# Patient Record
Sex: Male | Born: 2000 | Race: White | Hispanic: No | Marital: Single | Smoking: Never smoker
Health system: Southern US, Community
[De-identification: ages and names within clinical notes are randomized; demographics above are authoritative.]

## PROBLEM LIST (undated history)

## (undated) HISTORY — PX: WISDOM TOOTH EXTRACTION: SHX21

---
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
## 2023-04-29 ENCOUNTER — Ambulatory Visit (INDEPENDENT_AMBULATORY_CARE_PROVIDER_SITE_OTHER): Payer: Medicaid Other | Admitting: Podiatry

## 2023-04-29 DIAGNOSIS — L600 Ingrowing nail: Secondary | ICD-10-CM

## 2023-04-29 NOTE — Progress Notes (Signed)
  Subjective:  Patient ID: Drew Goodwin, male    DOB: 31-Oct-2000,  MRN: [id number]  Chief Complaint  Patient presents with   Ingrown Toenail    ingrown on seond toe on rt foot,swollen, pink,tender to touch    22 y.o. male presents with the above complaint.  Patient presents with right second medial border ingrown painful to touch is progressive and worsens with ambulation or shoe pressure he would like to have removed he has not seen MRIs prior to seeing me pain scale 7 out of 10 dull achy in nature.   Review of Systems: Negative except as noted in the HPI. Denies N/V/F/Ch.  No past medical history on file.  Current Outpatient Medications:    neomycin-polymyxin-hydrocortisone (CORTISPORIN) OTIC solution, Apply 1-2 drops to toe after soaking twice a day (Patient not taking: Reported on 04/29/2023), Disp: 10 mL, Rfl: 0  Social History   Tobacco Use  Smoking Status Never  Smokeless Tobacco Never    No Known Allergies Objective:  There were no vitals filed for this visit. There is no height or weight on file to calculate BMI. Constitutional Well developed. Well nourished.  Vascular Dorsalis pedis pulses palpable bilaterally. Posterior tibial pulses palpable bilaterally. Capillary refill normal to all digits.  No cyanosis or clubbing noted. Pedal hair growth normal.  Neurologic Normal speech. Oriented to person, place, and time. Epicritic sensation to light touch grossly present bilaterally.  Dermatologic Painful ingrowing nail at medial nail borders of the second toe nail right. No other open wounds. No skin lesions.  Orthopedic: Normal joint ROM without pain or crepitus bilaterally. No visible deformities. No bony tenderness.   Radiographs: None Assessment:   1. Ingrown nail of second toe of right foot    Plan:  Patient was evaluated and treated and all questions answered.  Ingrown Nail, right -Patient elects to proceed with minor surgery to remove ingrown toenail  removal today. Consent reviewed and signed by patient. -Ingrown nail excised. See procedure note. -Educated on post-procedure care including soaking. Written instructions provided and reviewed. -Patient to follow up in 2 weeks for nail check.  Procedure: Excision of Ingrown Toenail Location: Right 2nd toe medial nail borders. Anesthesia: Lidocaine 1% plain; 1.5 mL and Marcaine 0.5% plain; 1.5 mL, digital block. Skin Prep: Betadine. Dressing: Silvadene; telfa; dry, sterile, compression dressing. Technique: Following skin prep, the toe was exsanguinated and a tourniquet was secured at the base of the toe. The affected nail border was freed, split with a nail splitter, and excised. Chemical matrixectomy was then performed with phenol and irrigated out with alcohol. The tourniquet was then removed and sterile dressing applied. Disposition: Patient tolerated procedure well. Patient to return in 2 weeks for follow-up.   No follow-ups on file.

## 2023-10-08 ENCOUNTER — Ambulatory Visit: Payer: Medicaid Other | Admitting: Podiatry

## 2023-10-13 ENCOUNTER — Encounter: Payer: Self-pay | Admitting: Podiatry

## 2023-10-13 ENCOUNTER — Ambulatory Visit: Payer: Medicaid Other | Admitting: Podiatry

## 2023-10-13 ENCOUNTER — Ambulatory Visit (INDEPENDENT_AMBULATORY_CARE_PROVIDER_SITE_OTHER): Payer: Medicaid Other

## 2023-10-13 DIAGNOSIS — D492 Neoplasm of unspecified behavior of bone, soft tissue, and skin: Secondary | ICD-10-CM | POA: Diagnosis not present

## 2023-10-13 DIAGNOSIS — L600 Ingrowing nail: Secondary | ICD-10-CM | POA: Diagnosis not present

## 2023-10-13 DIAGNOSIS — M79672 Pain in left foot: Secondary | ICD-10-CM

## 2023-10-13 MED ORDER — FLUOROURACIL 5 % EX CREA: TOPICAL_CREAM | Freq: Two times a day (BID) | CUTANEOUS | 2 refills | Status: AC

## 2023-10-13 NOTE — Progress Notes (Signed)
 Subjective:   Patient ID: Drew Goodwin, male   DOB: 23 y.o.   MRN: [id number]   HPI Patient presents with mom with lesions on the bottom of the left foot which have occurred recently and also discomfort in general in the left foot with patient in college   ROS      Objective:  Physical Exam  Neuro vascular status found to be intact with 2 keratotic lesions plantar left foot upon debridement show pinpoint bleeding pain to pressure with moderate discomfort extending into the arch     Assessment:  Probable for fascial inflammation may be compensatory with the pain around the lesions consistent with benign neoplasm formation     Plan:  H&P discussed both conditions with x-ray.  At this point support shoes will be utilized and I went ahead to deep debridement of the lesions exposed the verruca's material and apply chemical agent to create immune response along with sterile dressings and begin home Efudex  usage.  Will be seen back depending on response to conservative care  X-rays indicate moderate depression of the arch did not indicate spur formation or indications of arthritis

## 2023-12-13 ENCOUNTER — Ambulatory Visit (INDEPENDENT_AMBULATORY_CARE_PROVIDER_SITE_OTHER): Payer: Medicaid Other | Admitting: Podiatry

## 2023-12-13 ENCOUNTER — Encounter: Payer: Self-pay | Admitting: Podiatry

## 2023-12-13 DIAGNOSIS — D492 Neoplasm of unspecified behavior of bone, soft tissue, and skin: Secondary | ICD-10-CM | POA: Diagnosis not present

## 2023-12-13 NOTE — Progress Notes (Signed)
 Subjective:   Patient ID: Drew Goodwin, male   DOB: 23 y.o.   MRN: [id number]   HPI Patient presents with mother with multiple lesions plantar aspect left that were doing well started to get symptomatic still using home medicine   ROS      Objective:  Physical Exam  Neuro vascular status intact keratotic lesion times approximate 5 plantar aspect left foot slight pinpoint bleeding upon sharp debridement     Assessment:  Verruca plantaris plantar aspect left     Plan:  Debridement lesions applied chemical agent to create immune response and applied sterile dressings and explained what to do if blistering were to occur.  Reappoint as needed

## 2024-03-16 ENCOUNTER — Ambulatory Visit: Admitting: Podiatry

## 2024-03-16 DIAGNOSIS — M2011 Hallux valgus (acquired), right foot: Secondary | ICD-10-CM

## 2024-03-16 NOTE — Progress Notes (Signed)
  Subjective:  Patient ID: Drew Goodwin, male    DOB: 2000-10-20,  MRN: [id number]  Chief Complaint  Patient presents with   Toe Pain    Right hallux pain in the joint     23 y.o. male presents with the above complaint.  Patient presents with right moderate bunion deformity.  Patient states that he is getting big toe pain.  He states he notices occasional shooting tingling against the bunion.  He has not seen MRIs prior to seeing me denies any other acute complaints would like to discuss treatment options for this pain scale is 5 out of 10 dull aching nature  Review of Systems: Negative except as noted in the HPI. Denies N/V/F/Ch.  No past medical history on file.  Current Outpatient Medications:    fluorouracil  (EFUDEX ) 5 % cream, Apply topically 2 (two) times daily., Disp: 40 g, Rfl: 2   neomycin -polymyxin-hydrocortisone (CORTISPORIN) OTIC solution, Apply 1-2 drops to toe after soaking twice a day, Disp: 10 mL, Rfl: 0  Social History   Tobacco Use  Smoking Status Never  Smokeless Tobacco Never    No Known Allergies Objective:  There were no vitals filed for this visit. There is no height or weight on file to calculate BMI. Constitutional Well developed. Well nourished.  Vascular Dorsalis pedis pulses palpable bilaterally. Posterior tibial pulses palpable bilaterally. Capillary refill normal to all digits.  No cyanosis or clubbing noted. Pedal hair growth normal.  Neurologic Normal speech. Oriented to person, place, and time. Epicritic sensation to light touch grossly present bilaterally.  Dermatologic Nails well groomed and normal in appearance. No open wounds. No skin lesions.  Orthopedic: Normal joint ROM without pain or crepitus bilaterally. Hallux abductovalgus deformity present moderate.  This is track bound not a tracking deformity. Left 1st MPJ diminished range of motion. Left 1st TMT without gross hypermobility. Right 1st MPJ diminished range of motion   Right 1st TMT without gross hypermobility. Lesser digital contractures absent bilaterally.   Radiographs: None  Assessment:   1. Hav (hallux abducto valgus), right    Plan:  Patient was evaluated and treated and all questions answered.  Hallux abductovalgus deformity, right - All questions and concerns were discussed with the patient in extensive detail - Given that this is a moderate bunion deformity with some pain I believe patient would benefit from shoe gear modification I discussed extensively with the patient he states understanding.  If it continues to regresses he will come back and see me and we will discuss chevron osteotomy with a possible phalangeal osteotomy No follow-ups on file.

## 2024-04-25 ENCOUNTER — Ambulatory Visit (INDEPENDENT_AMBULATORY_CARE_PROVIDER_SITE_OTHER): Admitting: Podiatry

## 2024-04-25 DIAGNOSIS — L600 Ingrowing nail: Secondary | ICD-10-CM

## 2024-04-25 NOTE — Progress Notes (Signed)
 Patient complains of painful ingrown medial border(s) toe 1 right. Patient denies fevers, chills, nausea, vomiting.  Objective:  Vitals: Reviewed  General: Well developed, nourished, in no acute distress, alert and oriented x3   Vascular: DP pulse 2/4 bilateral. PT pulse 2/4 bilateral.  Minimal edema hallux right  Dermatology: Erythema, edema, incurvated nail border medial border hallux right with slight clear drainage . Tenderness present with palpation. Normal skin tone and texture feet with normal hair growth.  Neurological: Grossly intact. Normal reflexes.   Musculoskeletal: Tenderness with palpation of the distal hallux right. No tenderness or painful ROM at IPJ.  Diagnosis: Ingrown nail medial border hallux right  Plan: -discussed etiology and treatment of ingrown nails. Discussed surgical vs conservative treatment. -Consent signed for appropriate matrixectomy affected nail(s).   Procedure(s):   - Matrixectomy(s) medial nail border hallux right: Toe anesthetized with 3cc 2:1 mixture 2% Lidocaine  with epinephrine: Sodium Bicarbonate. Surgical site prepped. Digital tourniquet applied.  Avulsion of nail border. performed. Matrixecomy performed with three 30 second applications of phenol to nail matrix. Site irrigated with alcohol.  Tourniquet released with good vascularity noticed in digit.  Applied triple antibiotic to nailbed and applied gauze and Coban dressing. - Written and oral postoperative instructions given.  -Return for post-op 2 weeks.  JINNY Prentice Binder, DPM

## 2024-04-25 NOTE — Patient Instructions (Signed)

## 2024-05-09 ENCOUNTER — Encounter: Admitting: Podiatry
